# Patient Record
Sex: Female | Born: 2000 | Race: Black or African American | Hispanic: No | Marital: Single | Smoking: Never smoker
Health system: Southern US, Community
[De-identification: ages and names within clinical notes are randomized; demographics above are authoritative.]

## PROBLEM LIST (undated history)

## (undated) HISTORY — PX: NO PAST SURGERIES: SHX2092

---
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
## 2022-04-15 ENCOUNTER — Encounter: Payer: Self-pay | Admitting: Emergency Medicine

## 2022-04-15 ENCOUNTER — Other Ambulatory Visit: Payer: Self-pay

## 2022-04-15 ENCOUNTER — Ambulatory Visit
Admission: EM | Admit: 2022-04-15 | Discharge: 2022-04-15 | Disposition: A | Discharge status: Home / Self Care | Payer: BC Managed Care – PPO | Attending: Emergency Medicine | Admitting: Emergency Medicine

## 2022-04-15 DIAGNOSIS — J039 Acute tonsillitis, unspecified: Secondary | ICD-10-CM

## 2022-04-15 LAB — GROUP A STREP BY PCR: Group A Strep by PCR: NOT DETECTED

## 2022-04-15 MED ORDER — LIDOCAINE VISCOUS HCL 2 % MT SOLN: 15.0000 mL | Freq: Four times a day (QID) | OROMUCOSAL | 0 refills | Status: AC | PRN

## 2022-04-15 NOTE — ED Triage Notes (Signed)
Patient c/o left sided sore throat and swollen tonsil that started yesterday.  Patient denies fevers.

## 2022-04-15 NOTE — Discharge Instructions (Addendum)
Use the viscous lidocaine, 15 mL before meals and at bedtime as needed for pain relief. Tama Gander with warm salt water 2-3 times a day to soothe your throat, aid in pain relief, and aid in healing.  Take over-the-counter ibuprofen according to the package instructions as needed for pain.  You can also use Chloraseptic or Sucrets lozenges, 1 lozenge every 2 hours as needed for throat pain.  If you develop any new or worsening symptoms return for reevaluation.

## 2022-04-15 NOTE — ED Provider Notes (Signed)
[facility]    CSN: [id number] Arrival date & time: 04/15/22  1007      History   Chief Complaint Chief Complaint  Patient presents with   Sore Throat    HPI Peyten Punches is a 21 y.o. female.   HPI  21 year old female here for evaluation of sore throat.  Patient reports that she developed a sore throat yesterday states that it is only painful and left inside.  She has had a mild runny nose and nasal congestion.  She is also complaining of pain in her left ear.  She denies any fever or cough.  History reviewed. No pertinent past medical history.  There are no problems to display for this patient.   Past Surgical History:  Procedure Laterality Date   NO PAST SURGERIES      OB History   No obstetric history on file.      Home Medications    Prior to Admission medications   Medication Sig Start Date End Date Taking? Authorizing Provider  lidocaine (XYLOCAINE) 2 % solution Use as directed 15 mLs in the mouth or throat every 6 (six) hours as needed for mouth pain. 04/15/22  Yes Becky Augusta, NP  medroxyPROGESTERone (DEPO-PROVERA) 150 MG/ML injection Inject into the muscle. 10/11/21 10/19/22 Yes [provider]  TRI-LO-MARZIA 0.18/0.215/0.25 MG-25 MCG tab Take 1 tablet by mouth daily. 07/22/21  Yes [provider]    Family History Family History  Problem Relation Age of Onset   Hypertension Mother    Hypertension Father     Social History Social History   Tobacco Use   Smoking status: Never   Smokeless tobacco: Never  Vaping Use   Vaping Use: Never used  Substance Use Topics   Alcohol use: No   Drug use: No     Allergies   Patient has no known allergies.   Review of Systems Review of Systems  Constitutional:  Negative for fever.  HENT:  Positive for congestion, ear pain, rhinorrhea and sore throat.   Respiratory:  Negative for cough.   Hematological: Negative.     Physical Exam Triage Vital Signs ED Triage  Vitals  Enc Vitals Group     BP 04/15/22 1040 118/76     Pulse Rate 04/15/22 1040 (!) 112     Resp 04/15/22 1040 14     Temp 04/15/22 1040 99.5 F (37.5 C)     Temp Source 04/15/22 1040 Oral     SpO2 04/15/22 1040 100 %     Weight 04/15/22 1037 149 lb (67.6 kg)     Height 04/15/22 1037 5\' 4"  (1.626 m)     Head Circumference --      Peak Flow --      Pain Score 04/15/22 1036 8     Pain Loc --      Pain Edu? --      Excl. in GC? --    No data found.  Updated Vital Signs BP 118/76 (BP Location: Left Arm)   Pulse (!) 112   Temp 99.5 F (37.5 C) (Oral)   Resp 14   Ht 5\' 4"  (1.626 m)   Wt 149 lb (67.6 kg)   LMP 04/08/2022 (Approximate)   SpO2 100%   BMI 25.58 kg/m   Visual Acuity Right Eye Distance:   Left Eye Distance:   Bilateral Distance:    Right Eye Near:   Left Eye Near:    Bilateral Near:     Physical Exam  Vitals and nursing note reviewed.  Constitutional:      Appearance: Normal appearance. She is not ill-appearing.  HENT:     Head: Normocephalic and atraumatic.     Right Ear: Tympanic membrane, ear canal and external ear normal. There is no impacted cerumen.     Left Ear: Tympanic membrane, ear canal and external ear normal. There is no impacted cerumen.     Nose: Congestion and rhinorrhea present.     Mouth/Throat:     Mouth: Mucous membranes are moist.     Pharynx: Oropharyngeal exudate and posterior oropharyngeal erythema present.  Cardiovascular:     Rate and Rhythm: Normal rate and regular rhythm.     Pulses: Normal pulses.     Heart sounds: Normal heart sounds. No murmur heard.   No friction rub. No gallop.  Pulmonary:     Effort: Pulmonary effort is normal.     Breath sounds: Normal breath sounds. No wheezing, rhonchi or rales.  Musculoskeletal:     Cervical back: Normal range of motion. Tenderness present.  Lymphadenopathy:     Cervical: Cervical adenopathy present.  Skin:    General: Skin is warm and dry.     Capillary Refill: Capillary  refill takes less than 2 seconds.     Findings: No erythema or rash.  Neurological:     General: No focal deficit present.     Mental Status: She is alert and oriented to person, place, and time.  Psychiatric:        Mood and Affect: Mood normal.        Behavior: Behavior normal.        Thought Content: Thought content normal.        Judgment: Judgment normal.     UC Treatments / Results  Labs (all labs ordered are listed, but only abnormal results are displayed) Labs Reviewed  GROUP A STREP BY PCR    EKG   Radiology No results found.  Procedures Procedures (including critical care time)  Medications Ordered in UC Medications - No data to display  Initial Impression / Assessment and Plan / UC Course  I have reviewed the triage vital signs and the nursing notes.  Pertinent labs & imaging results that were available during my care of the patient were reviewed by me and considered in my medical decision making (see chart for details).  Patient is a nontoxic-appearing 21 year old female here for evaluation of left-sided sore throat pain that began yesterday as outlined in HPI above.  Her physical exam reveals pearly-gray tympanic membranes bilaterally with normal light reflex and clear external auditory canals.  Nasal mucosa is mildly erythematous and edematous with scant clear discharge in both nares.  Oropharyngeal exam reveals 1+ tonsillar edema with erythema and exudate bilaterally.  Posterior oropharynx is unremarkable.  Patient does have bilateral anterior cervical lymphadenopathy that is tender to palpation.  Cardiopulmonary exam reveals S1-S2 heart sounds with regular rate and rhythm and lung sounds that are clear to auscultation in all fields.  Strep PCR was collected at triage and is pending.  Strep PCR is negative.  I will discharge patient home with a diagnosis of tonsillitis, most likely viral, and treat her with viscous lidocaine 4 times daily as needed for pain  relief as well as salt water gargles, over-the-counter ibuprofen, and over-the-counter Chloraseptic or Sucrets lozenges.  Return precautions reviewed with patient.   Final Clinical Impressions(s) / UC Diagnoses   Final diagnoses:  Tonsillitis     Discharge Instructions  Use the viscous lidocaine, 15 mL before meals and at bedtime as needed for pain relief. Tama Gander with warm salt water 2-3 times a day to soothe your throat, aid in pain relief, and aid in healing.  Take over-the-counter ibuprofen according to the package instructions as needed for pain.  You can also use Chloraseptic or Sucrets lozenges, 1 lozenge every 2 hours as needed for throat pain.  If you develop any new or worsening symptoms return for reevaluation.      ED Prescriptions     Medication Sig Dispense Auth. Provider   lidocaine (XYLOCAINE) 2 % solution Use as directed 15 mLs in the mouth or throat every 6 (six) hours as needed for mouth pain. 100 mL Becky Augusta, NP      PDMP not reviewed this encounter.   Becky Augusta, NP 04/15/22 1142

## 2024-09-24 ENCOUNTER — Ambulatory Visit
Admission: EM | Admit: 2024-09-24 | Discharge: 2024-09-24 | Disposition: A | Discharge status: Home / Self Care | Attending: Physician Assistant | Admitting: Physician Assistant

## 2024-09-24 DIAGNOSIS — R1084 Generalized abdominal pain: Secondary | ICD-10-CM

## 2024-09-24 DIAGNOSIS — K649 Unspecified hemorrhoids: Secondary | ICD-10-CM

## 2024-09-24 MED ORDER — HYDROCORTISONE (PERIANAL) 2.5 % EX CREA: 1.0000 | TOPICAL_CREAM | Freq: Two times a day (BID) | CUTANEOUS | 0 refills | Status: AC

## 2024-09-24 MED ORDER — HYDROCORTISONE ACETATE 25 MG RE SUPP: 25.0000 mg | Freq: Two times a day (BID) | RECTAL | 0 refills | Status: AC

## 2024-09-24 NOTE — Discharge Instructions (Addendum)
-  Use topical and suppository hydrocortisone for hemorrhoids. -Increase fluids -Start Senna stool softener over the counter -Warm baths a couple times per day -F/u with PCP if not improving in the next week

## 2024-09-24 NOTE — ED Triage Notes (Signed)
 Patient states that lower abdominal pain started Friday. Patient states that she's having pain and bleeding  in her anal area with a bump that started Sunday. Patient took tylenol this morning. No trauma to the area.

## 2024-09-24 NOTE — ED Provider Notes (Signed)
 [facility]    CSN: [id number] Arrival date & time: 09/24/24  1029      History   Chief Complaint Chief Complaint  Patient presents with   Hemorrhoids   Abdominal Pain    HPI Dawn Austin is a 23 y.o. female presenting for generalized abdominal pain x 5 days.  Reports noticing an area of swelling with bleeding around the anus a few days ago.  Patient believes it is a hemorrhoid.  She has been applying creams and powders to the area and taking Tylenol without much relief.  Patient reports that she has frequent BMs.  She says they are usually normal.  She reports she thought she was going too much so she took Imodium a couple days ago.  Denies blood mixed in the stool.  No fever, nausea/vomiting.  No history of hemorrhoids.  HPI  History reviewed. No pertinent past medical history.  There are no active problems to display for this patient.   Past Surgical History:  Procedure Laterality Date   NO PAST SURGERIES      OB History   No obstetric history on file.      Home Medications    Prior to Admission medications   Medication Sig Start Date End Date Taking? Authorizing Provider  hydrocortisone (ANUSOL-HC) 2.5 % rectal cream Place 1 Application rectally 2 (two) times daily. 09/24/24  Yes Arvis Jolan NOVAK, PA-C  hydrocortisone (ANUSOL-HC) 25 MG suppository Place 1 suppository (25 mg total) rectally 2 (two) times daily. 09/24/24  Yes Arvis Jolan B, PA-C  TRI-LO-MARZIA 0.18/0.215/0.25 MG-25 MCG tab Take 1 tablet by mouth daily. 07/22/21  Yes [provider]  lidocaine  (XYLOCAINE ) 2 % solution Use as directed 15 mLs in the mouth or throat every 6 (six) hours as needed for mouth pain. 04/15/22   Bernardino Ditch, NP  medroxyPROGESTERone (DEPO-PROVERA) 150 MG/ML injection Inject into the muscle. 10/11/21 10/19/22  [provider]    Family History Family History  Problem Relation Age of Onset   Hypertension Mother    Hypertension Father      Social History Social History   Tobacco Use   Smoking status: Never   Smokeless tobacco: Never  Vaping Use   Vaping status: Never Used  Substance Use Topics   Alcohol use: No   Drug use: No     Allergies   Patient has no known allergies.   Review of Systems Review of Systems  Constitutional:  Negative for appetite change, fatigue and fever.  Gastrointestinal:  Positive for abdominal pain, anal bleeding and rectal pain. Negative for abdominal distention, blood in stool, constipation, diarrhea, nausea and vomiting.     Physical Exam Triage Vital Signs ED Triage Vitals  Encounter Vitals Group     BP 09/24/24 1044 113/70     Girls Systolic BP Percentile --      Girls Diastolic BP Percentile --      Boys Systolic BP Percentile --      Boys Diastolic BP Percentile --      Pulse Rate 09/24/24 1044 97     Resp 09/24/24 1044 18     Temp 09/24/24 1044 99.1 F (37.3 C)     Temp Source 09/24/24 1044 Oral     SpO2 09/24/24 1044 97 %     Weight 09/24/24 1043 140 lb (63.5 kg)     Height --      Head Circumference --      Peak Flow --  Pain Score 09/24/24 1043 5     Pain Loc --      Pain Education --      Exclude from Growth Chart --    No data found.  Updated Vital Signs BP 113/70 (BP Location: Left Arm)   Pulse 97   Temp 99.1 F (37.3 C) (Oral)   Resp 18   Wt 140 lb (63.5 kg)   LMP 09/02/2024 (Exact Date)   SpO2 97%   BMI 24.03 kg/m       Physical Exam Vitals and nursing note reviewed. Exam conducted with a chaperone present (Tiffany, MA).  Constitutional:      General: She is not in acute distress.    Appearance: Normal appearance. She is not ill-appearing or toxic-appearing.  HENT:     Head: Normocephalic and atraumatic.     Nose: Nose normal.     Mouth/Throat:     Mouth: Mucous membranes are moist.     Pharynx: Oropharynx is clear.  Eyes:     General: No scleral icterus.       Right eye: No discharge.        Left eye: No discharge.      Conjunctiva/sclera: Conjunctivae normal.  Cardiovascular:     Rate and Rhythm: Normal rate and regular rhythm.     Heart sounds: Normal heart sounds.  Pulmonary:     Effort: Pulmonary effort is normal. No respiratory distress.     Breath sounds: Normal breath sounds.  Abdominal:     Tenderness: There is generalized abdominal tenderness.  Genitourinary:    Comments: Hemorrhoid present. Appears to be internal. Not bleeding at this time. Musculoskeletal:     Cervical back: Neck supple.  Skin:    General: Skin is dry.  Neurological:     General: No focal deficit present.     Mental Status: She is alert. Mental status is at baseline.     Motor: No weakness.     Gait: Gait normal.  Psychiatric:        Mood and Affect: Mood normal.        Behavior: Behavior normal.      UC Treatments / Results  Labs (all labs ordered are listed, but only abnormal results are displayed) Labs Reviewed - No data to display  EKG   Radiology No results found.  Procedures Procedures (including critical care time)  Medications Ordered in UC Medications - No data to display  Initial Impression / Assessment and Plan / UC Course  I have reviewed the triage vital signs and the nursing notes.  Pertinent labs & imaging results that were available during my care of the patient were reviewed by me and considered in my medical decision making (see chart for details).   23 year old female presents for generalized abdominal pain and an area of swelling with bleeding around the anus for the past several days.  Has taken Imodium, Tylenol and use topical creams and powders.  On evaluation she does have a hemorrhoid and mild generalized abdominal tenderness.  Will treat at this time with Anusol suppositories and cream.  Advised sitz bath's, senna and avoiding bearing down heavily.  Reviewed return precautions and PCP follow-up.   Final Clinical Impressions(s) / UC Diagnoses   Final diagnoses:   Hemorrhoids, unspecified hemorrhoid type  Generalized abdominal pain     Discharge Instructions      -Use topical and suppository hydrocortisone for hemorrhoids. -Increase fluids -Start Senna stool softener over the counter -Warm baths a  couple times per day -F/u with PCP if not improving in the next week     ED Prescriptions     Medication Sig Dispense Auth. Provider   hydrocortisone (ANUSOL-HC) 2.5 % rectal cream Place 1 Application rectally 2 (two) times daily. 30 g Arvis Huxley B, PA-C   hydrocortisone (ANUSOL-HC) 25 MG suppository Place 1 suppository (25 mg total) rectally 2 (two) times daily. 12 suppository Arvis Huxley NOVAK, PA-C      PDMP not reviewed this encounter.   Arvis Huxley NOVAK, PA-C 09/24/24 1123
